# Patient Record
Sex: Male | Born: 1970 | Hispanic: No | Marital: Married | Smoking: Former smoker
Health system: Southern US, Community
[De-identification: ages and names within clinical notes are randomized; demographics above are authoritative.]

## PROBLEM LIST (undated history)

## (undated) DIAGNOSIS — E119 Type 2 diabetes mellitus without complications: Secondary | ICD-10-CM

## (undated) DIAGNOSIS — M1990 Unspecified osteoarthritis, unspecified site: Secondary | ICD-10-CM

---

## 2011-12-30 ENCOUNTER — Emergency Department (INDEPENDENT_AMBULATORY_CARE_PROVIDER_SITE_OTHER): Payer: BC Managed Care – PPO

## 2011-12-30 ENCOUNTER — Emergency Department (HOSPITAL_BASED_OUTPATIENT_CLINIC_OR_DEPARTMENT_OTHER)
Admission: EM | Admit: 2011-12-30 | Discharge: 2011-12-30 | Disposition: A | Discharge status: Home / Self Care | Payer: BC Managed Care – PPO | Attending: Emergency Medicine | Admitting: Emergency Medicine

## 2011-12-30 ENCOUNTER — Encounter (HOSPITAL_BASED_OUTPATIENT_CLINIC_OR_DEPARTMENT_OTHER): Payer: Self-pay | Admitting: Emergency Medicine

## 2011-12-30 DIAGNOSIS — M7989 Other specified soft tissue disorders: Secondary | ICD-10-CM | POA: Insufficient documentation

## 2011-12-30 DIAGNOSIS — M25579 Pain in unspecified ankle and joints of unspecified foot: Secondary | ICD-10-CM

## 2011-12-30 DIAGNOSIS — S82839A Other fracture of upper and lower end of unspecified fibula, initial encounter for closed fracture: Secondary | ICD-10-CM

## 2011-12-30 DIAGNOSIS — R937 Abnormal findings on diagnostic imaging of other parts of musculoskeletal system: Secondary | ICD-10-CM

## 2011-12-30 DIAGNOSIS — W19XXXA Unspecified fall, initial encounter: Secondary | ICD-10-CM | POA: Insufficient documentation

## 2011-12-30 DIAGNOSIS — S82899A Other fracture of unspecified lower leg, initial encounter for closed fracture: Secondary | ICD-10-CM | POA: Insufficient documentation

## 2011-12-30 DIAGNOSIS — Y9289 Other specified places as the place of occurrence of the external cause: Secondary | ICD-10-CM | POA: Insufficient documentation

## 2011-12-30 HISTORY — DX: Unspecified osteoarthritis, unspecified site: M19.90

## 2011-12-30 MED ORDER — HYDROCODONE-ACETAMINOPHEN 5-325 MG PO TABS: 1.0000 | ORAL_TABLET | Freq: Four times a day (QID) | ORAL | Status: AC | PRN

## 2011-12-30 NOTE — ED Provider Notes (Addendum)
History     CSN: [id number]  Arrival date & time 12/30/11  9562   First MD Initiated Contact with Patient 12/30/11 [phone]      Chief Complaint  Patient presents with  . Ankle Pain    (Consider location/radiation/quality/duration/timing/severity/associated sxs/prior treatment) HPI Comments: Patient presents complaining of left ankle pain.  He states that he normally gets some swelling in both of his ankles after he is on his feet all day at work.  Last night he noted the left ankle was more swollen and more painful.  He notes no injuries.  This morning when he woke up he had difficulty bearing weight on it until he took some Tylenol which has improved his symptoms.  Patient comes in here for further evaluation.  He does note some history of arthritis.  He states about a year ago he did have some similar symptoms which he believes he received prednisone for it to improve his symptoms.  He has no wounds, fevers, erythema or warmth to the area.  Patient can flex and extend the ankle without significant difficulty and was able to ambulate here in the emergency department.  Patient is a 41 y.o. male presenting with ankle pain. The history is provided by the patient. No language interpreter was used.  Ankle Pain  The incident occurred yesterday. Incident location: No injury. There was no injury mechanism. The pain is present in the left ankle. The quality of the pain is described as aching and throbbing. The pain is mild. The pain has been constant since onset. Pertinent negatives include no numbness, no inability to bear weight, no loss of motion, no muscle weakness, no loss of sensation and no tingling. He reports no foreign bodies present. The symptoms are aggravated by activity and bearing weight. He has tried acetaminophen for the symptoms. The treatment provided moderate relief.    Past Medical History  Diagnosis Date  . Ulcerative colitis   . Arthritis     History reviewed. No pertinent  past surgical history.  History reviewed. No pertinent family history.  History  Substance Use Topics  . Smoking status: Former Games developer  . Smokeless tobacco: Not on file  . Alcohol Use: Yes     social      Review of Systems  Constitutional: Negative.  Negative for fever and chills.  HENT: Negative.   Eyes: Negative.  Negative for discharge and redness.  Respiratory: Negative.  Negative for cough and shortness of breath.   Cardiovascular: Negative.  Negative for chest pain.  Gastrointestinal: Negative.  Negative for nausea, vomiting and abdominal pain.  Genitourinary: Negative.  Negative for hematuria.  Musculoskeletal: Positive for joint swelling and arthralgias. Negative for back pain.  Skin: Negative.  Negative for color change and rash.  Neurological: Negative for tingling, syncope, numbness and headaches.  Hematological: Negative.  Negative for adenopathy.  Psychiatric/Behavioral: Negative.  Negative for confusion.  All other systems reviewed and are negative.    Allergies  Review of patient's allergies indicates no known allergies.  Home Medications  No current outpatient prescriptions on file.  BP 145/87  Pulse 86  Temp(Src) 98.3 F (36.8 C) (Oral)  Resp 16  Ht 6\' 1"  (1.854 m)  Wt 240 lb (108.863 kg)  BMI 31.66 kg/m2  SpO2 96%  Physical Exam  Nursing note and vitals reviewed. Constitutional: He is oriented to person, place, and time. He appears well-developed and well-nourished.  Non-toxic appearance. He does not have a sickly appearance.  HENT:  Head: Normocephalic  and atraumatic.  Eyes: Conjunctivae, EOM and lids are normal. Pupils are equal, round, and reactive to light.  Neck: Trachea normal, normal range of motion and full passive range of motion without pain. Neck supple.  Cardiovascular: Regular rhythm.   Pulmonary/Chest: Effort normal. No respiratory distress.  Abdominal: Soft. Normal appearance. There is no CVA tenderness.  Musculoskeletal:  Normal range of motion. He exhibits edema and tenderness.       Patient does have left ankle swelling without erythema or warmth.  There is a focal area of tenderness at his posterior lateral ankle next the Achilles tendon.  No wounds are noted.  Palpable DP pulse.  Capillary refill less than 2 seconds.  Neurological: He is alert and oriented to person, place, and time. He has normal strength.  Skin: Skin is warm, dry and intact. No rash noted.  Psychiatric: He has a normal mood and affect. His behavior is normal. Judgment and thought content normal.    ED Course  Procedures (including critical care time) No results found for this or any previous visit. Dg Ankle Complete Left  12/30/2011  *RADIOLOGY REPORT*  Clinical Data: Pain and swelling  LEFT ANKLE COMPLETE - 3+ VIEW  Comparison: None.  Findings: Four views of the left ankle submitted.  Ankle mortise is preserved.  There is  subtle cortical lucency in the distal fibula and deformity of the tip of distal fibula.  This is suspicious for a subtle impacted fracture of distal fibula.  There is adjacent soft tissue swelling.  Clinical correlation is necessary.  IMPRESSION:  There is  subtle cortical lucency in the distal fibula and deformity of the tip of distal fibula.  This is suspicious for a subtle impacted fracture of distal fibula.  There is adjacent soft tissue swelling.  Clinical correlation is necessary.  Original Report Authenticated By: Natasha Mead, M.D.   US Venous Img Lower Unilateral Left  12/30/2011  **ADDENDUM** CREATED: 12/30/2011 12:31:51  This addendum is given for the purpose of noting that the initially dictated report incorrectly referred to the left upper extremity. The left lower extremity was imaged. There is no DVT.  **END ADDENDUM** SIGNED BY: Maisie Fus L. Maricela Curet, M.D.    12/30/2011  *RADIOLOGY REPORT*  Clinical Data:  Left lower extremity swelling.  LEFT UPPER EXTREMITY VENOUS DUPLEX ULTRASOUND  Technique:  Gray-scale sonography  with graded compression, as well as color Doppler and duplex ultrasound were performed to evaluate the upper extremity deep venous system from the level of the subclavian vein and including the jugular, axillary, basilic and upper cephalic vein.  Spectral Doppler was utilized to evaluate flow at rest and with distal augmentation maneuvers.  Comparison:  None.  Findings:  Normal compressibility of the upper extremity deep veins is demonstrated.  No venous filling defects visualized on grayscale or color Doppler US.  Normal direction of flow is seen throughout the deep veins.  Spectral Doppler waveforms show normal morphology at rest and with distal augmentation.  IMPRESSION: No evidence of upper extremity deep venous thrombosis.  Original Report Authenticated By: Bernadene Bell. D'ALESSIO, M.D.      MDM  I will obtain an ankle x-ray to evaluate for further signs of arthritis or other injury.  While the patient is otherwise low risk for blood clot given the unilateral leg swelling I will obtain a lower extremity Doppler to rule out DVT.        Nat Christen, MD 12/30/11 [phone]  Patient has no DVT on his ultrasound today.  He does have a possible subtle impacted fracture of his distal fibula.  I will offer the patient an air splint and crutches to be used as needed and followup with a orthopedic or sports medicine specialist for further evaluation.  Nat Christen, MD 12/30/11 [phone]

## 2011-12-30 NOTE — ED Notes (Signed)
Pt returned from radiology.

## 2011-12-30 NOTE — ED Notes (Signed)
Pt c/o LT ankle pain/swelling since last pm; no known injury; has problems with swelling after working (on feet all day), but usually goes down in the am

## 2011-12-30 NOTE — ED Notes (Signed)
Delay for [place] d/t [place] tech hours begin at 11am; test was inadvertently ordered under "vascular," therefore did not show up in radiology; order re-entered; radiology aware; pt made aware.

## 2013-03-26 ENCOUNTER — Emergency Department (HOSPITAL_BASED_OUTPATIENT_CLINIC_OR_DEPARTMENT_OTHER)
Admission: EM | Admit: 2013-03-26 | Discharge: 2013-03-26 | Disposition: A | Discharge status: Home / Self Care | Payer: BC Managed Care – PPO | Attending: Emergency Medicine | Admitting: Emergency Medicine

## 2013-03-26 ENCOUNTER — Encounter (HOSPITAL_BASED_OUTPATIENT_CLINIC_OR_DEPARTMENT_OTHER): Payer: Self-pay | Admitting: *Deleted

## 2013-03-26 DIAGNOSIS — Z8739 Personal history of other diseases of the musculoskeletal system and connective tissue: Secondary | ICD-10-CM | POA: Insufficient documentation

## 2013-03-26 DIAGNOSIS — T1500XA Foreign body in cornea, unspecified eye, initial encounter: Secondary | ICD-10-CM | POA: Insufficient documentation

## 2013-03-26 DIAGNOSIS — H5789 Other specified disorders of eye and adnexa: Secondary | ICD-10-CM | POA: Insufficient documentation

## 2013-03-26 DIAGNOSIS — H5710 Ocular pain, unspecified eye: Secondary | ICD-10-CM | POA: Insufficient documentation

## 2013-03-26 DIAGNOSIS — T1590XA Foreign body on external eye, part unspecified, unspecified eye, initial encounter: Secondary | ICD-10-CM | POA: Insufficient documentation

## 2013-03-26 DIAGNOSIS — T1502XA Foreign body in cornea, left eye, initial encounter: Secondary | ICD-10-CM

## 2013-03-26 DIAGNOSIS — Z87891 Personal history of nicotine dependence: Secondary | ICD-10-CM | POA: Insufficient documentation

## 2013-03-26 DIAGNOSIS — K5190 Ulcerative colitis, unspecified, without complications: Secondary | ICD-10-CM | POA: Insufficient documentation

## 2013-03-26 DIAGNOSIS — Y929 Unspecified place or not applicable: Secondary | ICD-10-CM | POA: Insufficient documentation

## 2013-03-26 DIAGNOSIS — H538 Other visual disturbances: Secondary | ICD-10-CM | POA: Insufficient documentation

## 2013-03-26 DIAGNOSIS — Y939 Activity, unspecified: Secondary | ICD-10-CM | POA: Insufficient documentation

## 2013-03-26 MED ORDER — FLUORESCEIN SODIUM 1 MG OP STRP
1.0000 | ORAL_STRIP | Freq: Once | OPHTHALMIC | Status: AC
  Administered 2013-03-26: 1 via OPHTHALMIC
  Filled 2013-03-26: qty 1

## 2013-03-26 MED ORDER — CIPROFLOXACIN HCL 0.3 % OP SOLN
2.0000 [drp] | OPHTHALMIC | Status: DC
Start: 2013-03-26 — End: 2013-03-26
  Administered 2013-03-26: 2 [drp] via OPHTHALMIC
  Filled 2013-03-26: qty 2.5

## 2013-03-26 MED ORDER — TETRACAINE HCL 0.5 % OP SOLN
1.0000 [drp] | Freq: Once | OPHTHALMIC | Status: AC
  Administered 2013-03-26: 1 [drp] via OPHTHALMIC
  Filled 2013-03-26: qty 2

## 2013-03-26 NOTE — ED Notes (Signed)
Pt states his eye has been irritated for 2 days. Thinks he may have gotten something in it while putting stuff up at work. Has been rinsing with water and using Visine. Eye is red. Light sensitive.

## 2013-03-26 NOTE — ED Provider Notes (Signed)
History     CSN: [id number]  Arrival date & time 03/26/13  1132   First MD Initiated Contact with Patient 03/26/13 1228      Chief Complaint  Patient presents with  . Eye Problem    (Consider location/radiation/quality/duration/timing/severity/associated sxs/prior treatment) HPI Comments: Pt was working on Thursday and felt dust or other material get into his eye, left.  Pt flushed and has used viseine withotu sig relief.  Has gotten more red, teary.  No HA, fever.  Vision has gotten more blurred.  Pt tried to work today, came to the ED from work . In bright light, pain is worse.  With sun glasses in place, no sig pain.  Does not wear contacts or glasses, does not have an eye doctor.    The history is provided by the patient.    Past Medical History  Diagnosis Date  . Ulcerative colitis   . Arthritis     History reviewed. No pertinent past surgical history.  History reviewed. No pertinent family history.  History  Substance Use Topics  . Smoking status: Former Games developer  . Smokeless tobacco: Not on file  . Alcohol Use: Yes     Comment: social      Review of Systems  Eyes: Positive for pain, discharge, redness and visual disturbance.  Gastrointestinal: Negative for nausea and vomiting.  Skin: Negative for rash.  Neurological: Negative for dizziness and headaches.    Allergies  Review of patient's allergies indicates no known allergies.  Home Medications  No current outpatient prescriptions on file.  BP 147/102  Pulse 94  Temp(Src) 99.7 F (37.6 C) (Oral)  Resp 20  Ht 6\' 1"  (1.854 m)  Wt 242 lb (109.77 kg)  BMI 31.93 kg/m2  SpO2 98%  Physical Exam  Nursing note and vitals reviewed. Constitutional: He appears well-developed and well-nourished.  HENT:  Head: Normocephalic and atraumatic.  Eyes: EOM are normal. Pupils are equal, round, and reactive to light. Left eye exhibits chemosis. Left conjunctiva is injected. Left conjunctiva has no hemorrhage. Left  eye exhibits normal extraocular motion.  Slit lamp exam:      The left eye shows foreign body and fluorescein uptake. The left eye shows no corneal abrasion, no corneal flare and no corneal ulcer.    Pulmonary/Chest: Effort normal. No respiratory distress.  Neurological: He is alert.  Skin: Skin is warm.    ED Course  Procedures (including critical care time)  Labs Reviewed - No data to display No results found.   1. Corneal foreign body, left, initial encounter     ra sat is 98% and I interpret to be normal  MDM  Pt with corneal FB on left eye.  Dr. Gwen Pounds will see pt in office today or Monday.  Pt's contact information shared with Dr. Gwen Pounds.  Ok with abx eye drops from here, pt repors pain is not very severe.          Gavin Pound. Wallis Vancott, MD 03/26/13 1410

## 2013-03-26 NOTE — Discharge Instructions (Signed)
Corneal Foreign Body  A corneal foreign body is an injury from material in your eye. This foreign body became stuck in (lodged) in the clear layer that covers the front part of the eye. Specks of metal, sand or wood commonly cause this injury. Using a local anesthetic, your caregiver removed the foreign body in your cornea. This local anesthetic is a medication that makes the cornea numb. Your eye will be painful when the local anesthetic wears off. Blinking the eye increases pain, so sometimes a patch is applied to eliminate this. The more you rest your "good eye", the better both eyes will feel.  HOME CARE INSTRUCTIONS    The use of eye patches is not universal and their use varies from state to state and from caregiver to caregiver. If eye patch was applied:   Keep your eye patch on for as long as directed by your caregiver until your follow-up appointment.   Do NOT remove the patch unless instructed to do so to put in medications; then replace patch and re-tape it as it was before. Follow the same procedure if the patch becomes loose.   WARNING: Do not drive or operate machinery while your eye is patched. Your ability to judge distances is impaired.   If no eye patch was applied:   Keep your eye closed as much as possible. Do not rub your eye.   Wear dark glasses for as long as directed by your caregiver to protect your eyes from bright light.   Do not wear contact lenses for as long as directed by your caregiver.   Wear protective eye covering if your job or hobby involves the risk of eye injury. This is especially important when working with high speed tools.   Only take over-the-counter or prescription medicines for pain, discomfort, or fever as directed by your caregiver.  SEEK IMMEDIATE MEDICAL CARE IF:    Pain increases in your eye or your vision changes.   You have problems with your eye patch.   The injury to your eye appears to be getting larger.   You develop any kind of discharge from  the injured eye.   Swelling and/or soreness (inflammation) develops around the affected eye.   An oral temperature above 102 F (38.9 C) develops.  MAKE SURE YOU:    Understand these instructions.   Will watch your condition.   Will get help right away if you are not doing well or get worse.  Document Released: 11/28/2000 Document Revised: 02/23/2012 Document Reviewed: 07/19/2008  [employer] Patient Information 2013 [employer].

## 2013-04-10 IMAGING — US US EXTREM LOW VENOUS*L*
1 series · 13 of 21 positions shown · non-contrast
Comparison: None.
COMPARISON: None.

<!--  IDXRADR:ADDEND:BEGIN -->Addendum Begins
<!--  IDXRADR:ADDEND:INNER_BEGIN -->***ADDENDUM*** CREATED: 12/30/2011 [DATE]

This addendum is given for the purpose of noting that the initially
dictated report incorrectly referred to the left upper extremity.
The left lower extremity was imaged. There is no DVT.
***END ADDENDUM*** SIGNED BY: Sonali Rafa, M.D.
CLINICAL DATA: Left lower extremity swelling.
LEFT UPPER EXTREMITY VENOUS DUPLEX ULTRASOUND
TECHNIQUE: Gray-scale sonography with graded compression, as well
as color Doppler and duplex ultrasound were performed to evaluate
the upper extremity deep venous system from the level of the
subclavian vein and including the jugular, axillary, basilic and
upper cephalic vein.  Spectral Doppler was utilized to evaluate
flow at rest and with distal augmentation maneuvers.

[Series 1: us extrem low venous*left* · 13 of 21 slices shown]
[im 1/21]
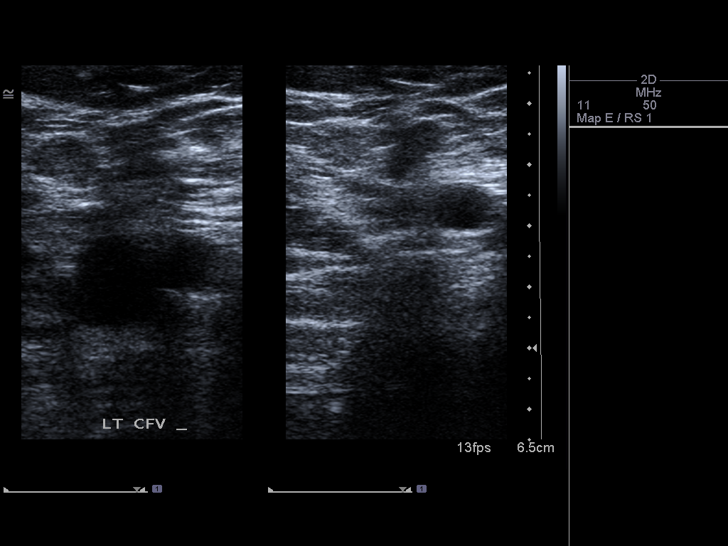
[im 3/21]
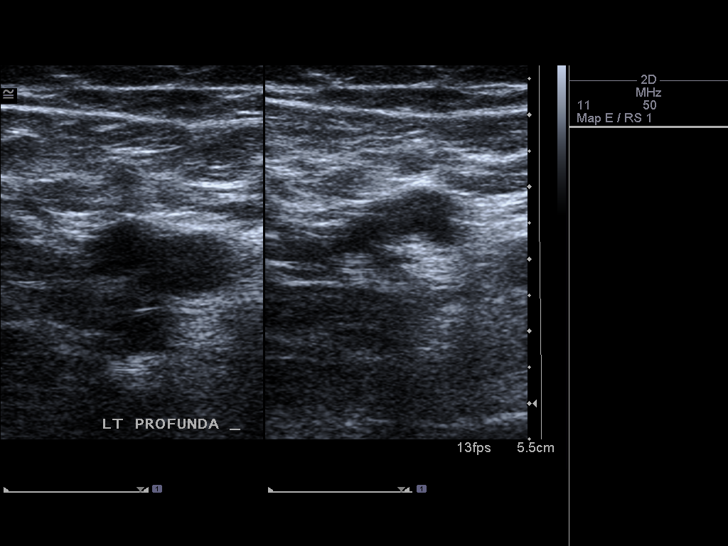
[im 5/21]
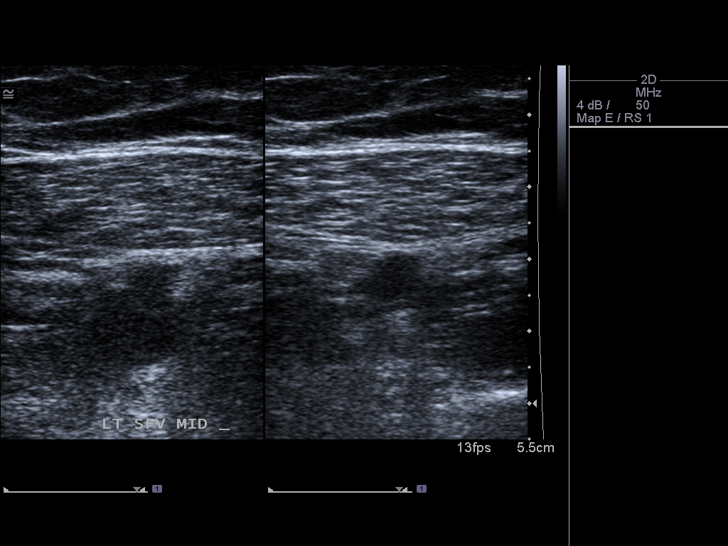
[im 6/21]
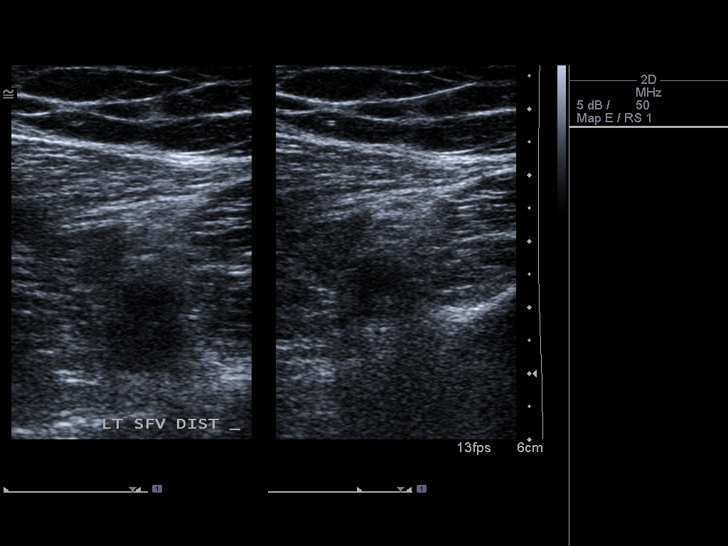
[im 8/21]
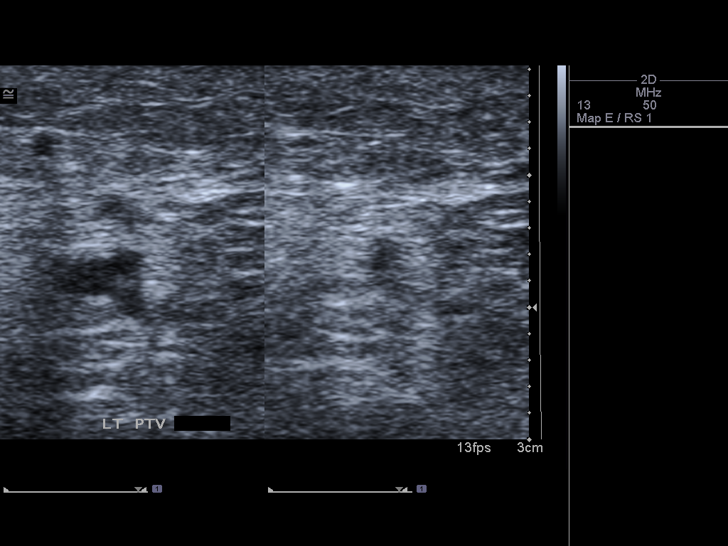
[im 9/21]
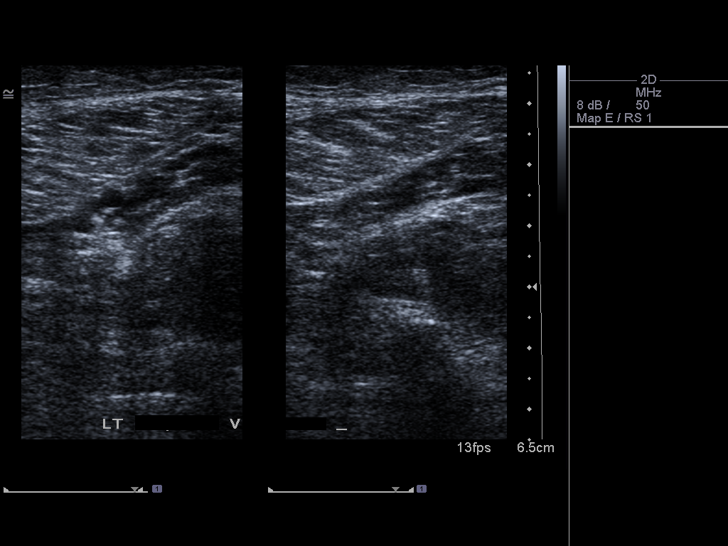
[im 11/21]
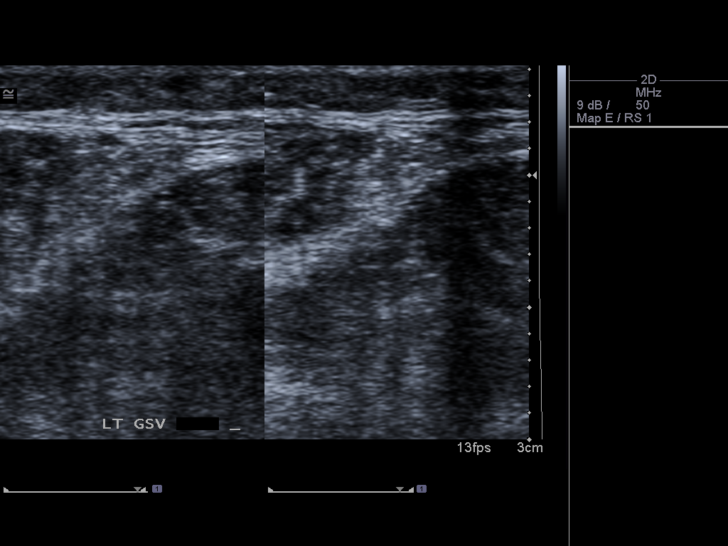
[im 13/21]
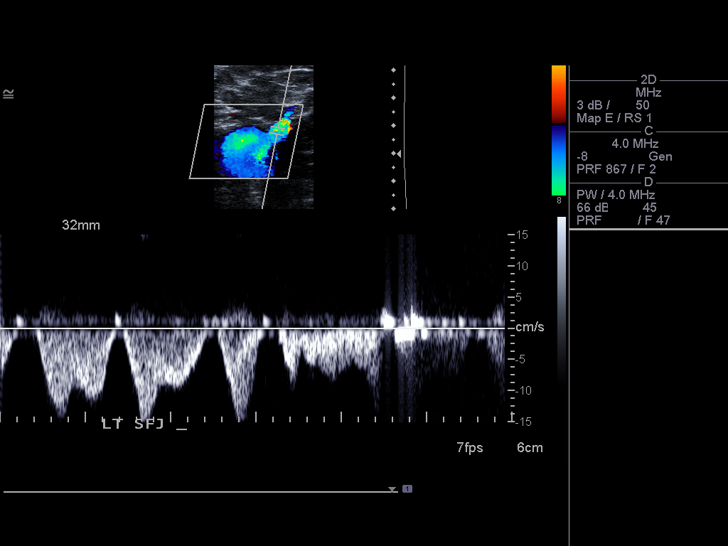
[im 14/21]
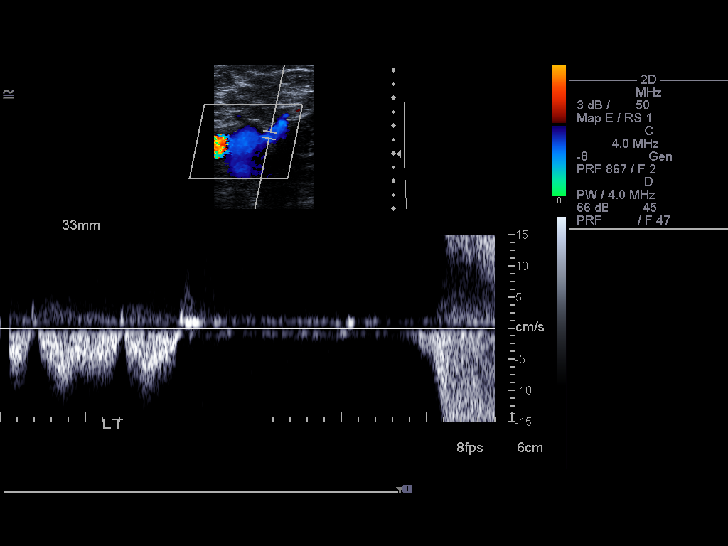
[im 16/21]
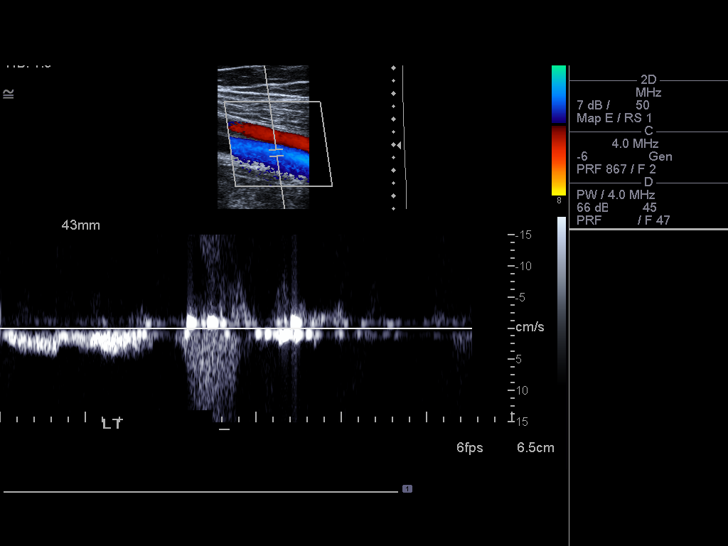
[im 17/21]
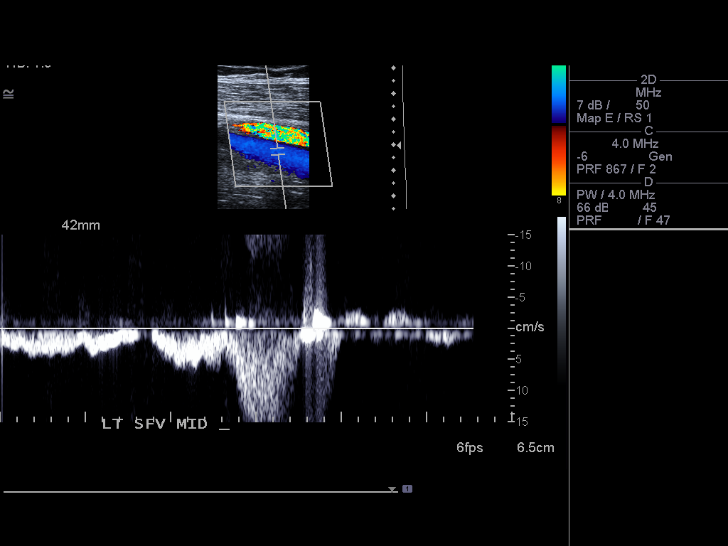
[im 19/21]
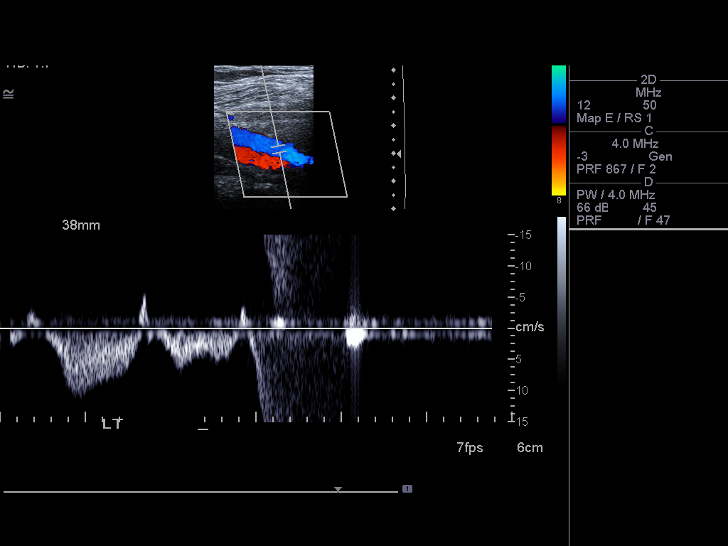
[im 21/21]
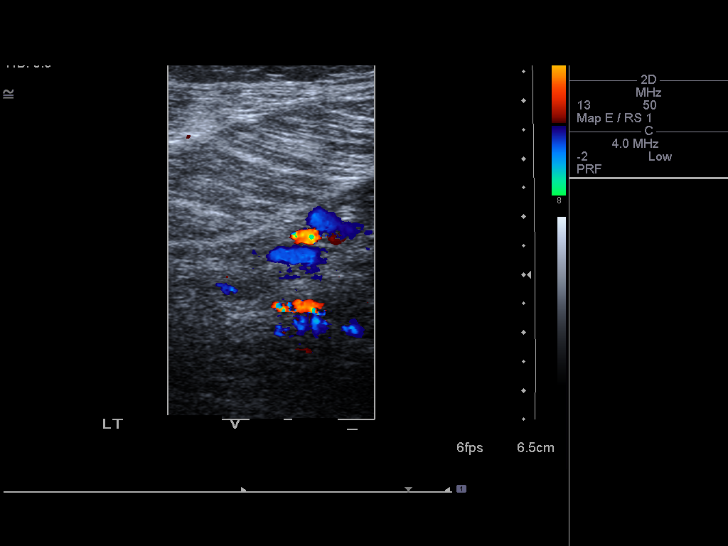

[13 of 21 positions shown; findings below may reference images not displayed]

FINDINGS: Normal compressibility of the upper extremity deep veins
is demonstrated.  No venous filling defects visualized on grayscale
or color Doppler US.  Normal direction of flow is seen throughout
the deep veins.  Spectral Doppler waveforms show normal morphology
at rest and with distal augmentation.
IMPRESSION: No evidence of upper extremity deep venous thrombosis.

<!--  IDXRADR:ADDEND:INNER_END -->Addendum Ends
<!--  IDXRADR:ADDEND:END -->*RADIOLOGY REPORT*
FINDINGS: Normal compressibility of the upper extremity deep veins
is demonstrated.  No venous filling defects visualized on grayscale
or color Doppler US.  Normal direction of flow is seen throughout
the deep veins.  Spectral Doppler waveforms show normal morphology
at rest and with distal augmentation.
IMPRESSION: No evidence of upper extremity deep venous thrombosis.

## 2013-11-02 ENCOUNTER — Telehealth: Payer: Self-pay | Admitting: Hematology & Oncology

## 2013-11-02 NOTE — Telephone Encounter (Signed)
Left vm w NEW PATIENT today to remind them of their appointment with Dr. Ennever. Also, advised them to bring all meds and insurance information. ° °

## 2013-11-03 ENCOUNTER — Ambulatory Visit: Payer: BC Managed Care – PPO

## 2013-11-03 ENCOUNTER — Other Ambulatory Visit (HOSPITAL_BASED_OUTPATIENT_CLINIC_OR_DEPARTMENT_OTHER): Payer: BC Managed Care – PPO | Admitting: Lab

## 2013-11-03 ENCOUNTER — Ambulatory Visit (HOSPITAL_BASED_OUTPATIENT_CLINIC_OR_DEPARTMENT_OTHER): Payer: BC Managed Care – PPO | Admitting: Hematology & Oncology

## 2013-11-03 VITALS — BP 145/87 | HR 88 | Temp 98.7°F | Resp 18 | Ht 72.0 in | Wt 235.0 lb

## 2013-11-03 DIAGNOSIS — D473 Essential (hemorrhagic) thrombocythemia: Secondary | ICD-10-CM

## 2013-11-03 LAB — FERRITIN CHCC: Ferritin: 190 ng/ml (ref 22–316)

## 2013-11-03 LAB — CHCC SATELLITE - SMEAR

## 2013-11-03 LAB — IRON AND TIBC CHCC
TIBC: 293 ug/dL (ref 202–409)
UIBC: 265 ug/dL (ref 117–376)

## 2013-11-03 LAB — CBC WITH DIFFERENTIAL (CANCER CENTER ONLY)
BASO#: 0 10*3/uL (ref 0.0–0.2)
EOS%: 4.6 % (ref 0.0–7.0)
Eosinophils Absolute: 0.4 10*3/uL (ref 0.0–0.5)
HGB: 12 g/dL — ABNORMAL LOW (ref 13.0–17.1)
LYMPH%: 19.2 % (ref 14.0–48.0)
MCH: 30.5 pg (ref 28.0–33.4)
MCHC: 31.8 g/dL — ABNORMAL LOW (ref 32.0–35.9)
MCV: 96 fL (ref 82–98)
MONO%: 7.7 % (ref 0.0–13.0)
Platelets: 432 10*3/uL — ABNORMAL HIGH (ref 145–400)
RBC: 3.94 10*6/uL — ABNORMAL LOW (ref 4.20–5.70)

## 2013-11-03 NOTE — Progress Notes (Signed)
This office note has been dictated.

## 2013-11-04 ENCOUNTER — Telehealth: Payer: Self-pay | Admitting: *Deleted

## 2013-11-04 NOTE — Telephone Encounter (Addendum)
Message copied by Mirian Capuchin on Fri Nov 04, 2013  1:29 PM ------      Message from: Josph Macho      Created: Fri Nov 04, 2013  6:22 AM       Call - iron levels are low!!  He really needs to see his GI doctor and have an upper and lower endoscopy done!!!  This is NECESSARY!!!!  If needed, I can make a referral!  Cindee Lame ------Left message on pt's home answering machine for pt to call us back about his iron levels.

## 2013-11-14 ENCOUNTER — Encounter: Payer: Self-pay | Admitting: Hematology & Oncology
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
## 2016-10-30 ENCOUNTER — Emergency Department (HOSPITAL_BASED_OUTPATIENT_CLINIC_OR_DEPARTMENT_OTHER)
Admission: EM | Admit: 2016-10-30 | Discharge: 2016-10-30 | Disposition: A | Discharge status: Home / Self Care | Payer: BLUE CROSS/BLUE SHIELD | Attending: Emergency Medicine | Admitting: Emergency Medicine

## 2016-10-30 ENCOUNTER — Encounter (HOSPITAL_BASED_OUTPATIENT_CLINIC_OR_DEPARTMENT_OTHER): Payer: Self-pay | Admitting: Emergency Medicine

## 2016-10-30 DIAGNOSIS — Z79899 Other long term (current) drug therapy: Secondary | ICD-10-CM | POA: Diagnosis not present

## 2016-10-30 DIAGNOSIS — Z87891 Personal history of nicotine dependence: Secondary | ICD-10-CM | POA: Diagnosis not present

## 2016-10-30 DIAGNOSIS — R739 Hyperglycemia, unspecified: Secondary | ICD-10-CM

## 2016-10-30 DIAGNOSIS — R350 Frequency of micturition: Secondary | ICD-10-CM | POA: Diagnosis present

## 2016-10-30 DIAGNOSIS — E1165 Type 2 diabetes mellitus with hyperglycemia: Secondary | ICD-10-CM | POA: Insufficient documentation

## 2016-10-30 HISTORY — DX: Type 2 diabetes mellitus without complications: E11.9

## 2016-10-30 LAB — COMPREHENSIVE METABOLIC PANEL
ALT: 31 U/L (ref 17–63)
AST: 29 U/L (ref 15–41)
Albumin: 4.3 g/dL (ref 3.5–5.0)
Alkaline Phosphatase: 99 U/L (ref 38–126)
Anion gap: 8 (ref 5–15)
BILIRUBIN TOTAL: 0.3 mg/dL (ref 0.3–1.2)
BUN: 19 mg/dL (ref 6–20)
CHLORIDE: 98 mmol/L — AB (ref 101–111)
CO2: 27 mmol/L (ref 22–32)
CREATININE: 0.99 mg/dL (ref 0.61–1.24)
Calcium: 10 mg/dL (ref 8.9–10.3)
Glucose, Bld: 346 mg/dL — ABNORMAL HIGH (ref 65–99)
POTASSIUM: 3.9 mmol/L (ref 3.5–5.1)
Sodium: 133 mmol/L — ABNORMAL LOW (ref 135–145)
TOTAL PROTEIN: 9.9 g/dL — AB (ref 6.5–8.1)

## 2016-10-30 LAB — URINE MICROSCOPIC-ADD ON

## 2016-10-30 LAB — CBC WITH DIFFERENTIAL/PLATELET
BASOS PCT: 0 %
Basophils Absolute: 0 10*3/uL (ref 0.0–0.1)
EOS PCT: 6 %
Eosinophils Absolute: 0.4 10*3/uL (ref 0.0–0.7)
HEMATOCRIT: 39.3 % (ref 39.0–52.0)
Hemoglobin: 13.1 g/dL (ref 13.0–17.0)
LYMPHS ABS: 1.9 10*3/uL (ref 0.7–4.0)
Lymphocytes Relative: 26 %
MCH: 30.3 pg (ref 26.0–34.0)
MCHC: 33.3 g/dL (ref 30.0–36.0)
MCV: 90.8 fL (ref 78.0–100.0)
MONO ABS: 0.7 10*3/uL (ref 0.1–1.0)
MONOS PCT: 10 %
NEUTROS ABS: 4.4 10*3/uL (ref 1.7–7.7)
Neutrophils Relative %: 58 %
PLATELETS: 351 10*3/uL (ref 150–400)
RBC: 4.33 MIL/uL (ref 4.22–5.81)
RDW: 12.5 % (ref 11.5–15.5)
WBC: 7.4 10*3/uL (ref 4.0–10.5)

## 2016-10-30 LAB — URINALYSIS, ROUTINE W REFLEX MICROSCOPIC
BILIRUBIN URINE: NEGATIVE
HGB URINE DIPSTICK: NEGATIVE
KETONES UR: NEGATIVE mg/dL
Leukocytes, UA: NEGATIVE
NITRITE: NEGATIVE
PH: 5.5 (ref 5.0–8.0)
Protein, ur: NEGATIVE mg/dL
SPECIFIC GRAVITY, URINE: 1.031 — AB (ref 1.005–1.030)

## 2016-10-30 LAB — CBG MONITORING, ED: Glucose-Capillary: 311 mg/dL — ABNORMAL HIGH (ref 65–99)

## 2016-10-30 MED ORDER — SODIUM CHLORIDE 0.9 % IV BOLUS (SEPSIS)
1000.0000 mL | Freq: Once | INTRAVENOUS | Status: AC
  Administered 2016-10-30: 1000 mL via INTRAVENOUS

## 2016-10-30 MED ORDER — METFORMIN HCL 500 MG PO TABS: 500.0000 mg | ORAL_TABLET | Freq: Two times a day (BID) | ORAL | 0 refills | Status: AC

## 2016-10-30 MED FILL — metFORMIN HCL 500 MG TABS: 500 | 15 days supply | Qty: 30 | Fill #0

## 2016-10-30 NOTE — ED Triage Notes (Signed)
Frequent urination noticed yesterday denies pain. Hx of DM and vasculitis.

## 2016-10-30 NOTE — ED Notes (Signed)
ED Provider at bedside. 

## 2016-10-30 NOTE — ED Provider Notes (Signed)
[facility] Provider Note   CSN: [id number] Arrival date & time: 10/30/16  [id number]     History   Chief Complaint Chief Complaint  Patient presents with  . Urinary Frequency    HPI Duane Kline is a 45 y.o. male.  HPI 45 year old male who presents with urinary frequency. History of vasculitis and Ulcerative colitis. Also history of diabetes mellitus which was diagnosed a few years ago after a bout of steroids. States that he was on insulin and metformin for a while, but had weight loss, with exercising, was able to be discontinued on his diabetes medications 2 years ago. Since then has not needed medications for diabetes. States that over the summer his wife lost her job, and as a result he has been working more, eating less consciously, and having less exercise. Has had cold-like symptoms 2 weeks ago, which have been resolving. On this week as noticed that he has been having increased thirst and increased urination. This was worse last night where he was urinating every 5-10 minutes.  No fevers, chills, chest pain, difficulty breathing, nausea or vomiting, diarrhea, abdominal pain. No dysuria. He has history of chronic left lower extremity wound, which she has recently been taking Bactrim for it, but states that his wound has looked a lot better than what it normally has.   Past Medical History:  Diagnosis Date  . Arthritis   . Diabetes mellitus without complication ([place])   . Ulcerative colitis     There are no active problems to display for this patient.   History reviewed. No pertinent surgical history.     Home Medications    Prior to Admission medications   Medication Sig Start Date End Date Taking? Authorizing Provider  sulfamethoxazole-trimethoprim (BACTRIM DS,SEPTRA DS) 800-160 MG tablet Take 1 tablet by mouth 2 (two) times daily.   Yes Historical Provider, MD  hydrochlorothiazide (HYDRODIURIL) 25 MG tablet Take 25 mg by mouth daily.    Historical  Provider, MD  metFORMIN (GLUCOPHAGE) 500 MG tablet Take 1 tablet (500 mg total) by mouth 2 (two) times daily with a meal. 10/30/16   Forde Dandy, MD    Family History No family history on file. Reviewed, non-contributory Social History Social History  Substance Use Topics  . Smoking status: Former Research scientist (life sciences)  . Smokeless tobacco: Never Used  . Alcohol use Yes     Comment: social     Allergies   Patient has no known allergies.   Review of Systems Review of Systems 10/14 systems reviewed and are negative other than those stated in the HPI   Physical Exam Updated Vital Signs BP (!) 159/106 (BP Location: Left Arm)   Pulse 100   Temp 98.2 F (36.8 C) (Oral)   Resp 18   Ht 6\' 1"  (1.854 m)   Wt 236 lb (107 kg)   SpO2 100%   BMI 31.14 kg/m   Physical Exam Physical Exam  Nursing note and vitals reviewed. Constitutional: Well developed, well nourished, non-toxic, and in no acute distress Head: Normocephalic and atraumatic.  Mouth/Throat: Oropharynx is clear and moist.  Neck: Normal range of motion. Neck supple.  Cardiovascular: Normal rate and regular rhythm.   Pulmonary/Chest: Effort normal and breath sounds normal.  Abdominal: Soft. There is no tenderness. There is no rebound and no guarding.  Musculoskeletal: Chronic LLE wound, w/o significant drainage or tenderness Neurological: Alert, no facial droop, fluent speech, moves all extremities symmetrically Skin: Skin is warm and dry.  Psychiatric:  Cooperative   ED Treatments / Results  Labs (all labs ordered are listed, but only abnormal results are displayed) Labs Reviewed  URINALYSIS, ROUTINE W REFLEX MICROSCOPIC (NOT AT [facility]) - Abnormal; Notable for the following:       Result Value   Specific Gravity, Urine 1.031 (*)    Glucose, UA >1000 (*)    All other components within normal limits  COMPREHENSIVE METABOLIC PANEL - Abnormal; Notable for the following:    Sodium 133 (*)    Chloride 98 (*)    Glucose, Bld  346 (*)    Total Protein 9.9 (*)    All other components within normal limits  URINE MICROSCOPIC-ADD ON - Abnormal; Notable for the following:    Squamous Epithelial / LPF 0-5 (*)    Bacteria, UA RARE (*)    All other components within normal limits  CBG MONITORING, ED - Abnormal; Notable for the following:    Glucose-Capillary 311 (*)    All other components within normal limits  CBC WITH DIFFERENTIAL/PLATELET    EKG  EKG Interpretation None       Radiology No results found.  Procedures Procedures (including critical care time)  Medications Ordered in ED Medications  sodium chloride 0.9 % bolus 1,000 mL (0 mLs Intravenous Stopped 10/30/16 0927)     Initial Impression / Assessment and Plan / ED Course  I have reviewed the triage vital signs and the nursing notes.  Pertinent labs & imaging results that were available during my care of the patient were reviewed by me and considered in my medical decision making (see chart for details).  Clinical Course     Presenting with urinary frequency and increased thirst over the course of this past week. This is due to his hyperglycemia as his point-of-care glucose is 311 here today. Has previously been treated for type 2 diabetes with insulin and metformin, but able to be weaned off of these medications due to weight loss and exercise. With his recent lifestyle changes, this is likely causing him to have recurrent blood sugar issues. No evidence of serious infection on exam. He has chronic wound in the left lower extremity, looking better than before, and unlikely to be causing his symptoms here today. Mild URI symptoms, resolving, and felt unlikely to be URI to have drastically caused this. No evidence of DKA blood work, otherwise reassuring. We will have patient restart metformin, and have very close follow-up with the primary care doctor for further management.  Final Clinical Impressions(s) / ED Diagnoses   Final diagnoses:    Hyperglycemia    New Prescriptions New Prescriptions   METFORMIN (GLUCOPHAGE) 500 MG TABLET    Take 1 tablet (500 mg total) by mouth 2 (two) times daily with a meal.     Forde Dandy, MD 10/30/16 [id number]

## 2016-10-30 NOTE — ED Notes (Signed)
Pt verbalized understanding of discharge instructions and denies any further questions at this time.   Pt will go down to the pharmacy

## 2016-10-30 NOTE — Discharge Instructions (Signed)
Please call your primary care to set up close follow-up appointment regarding management of your blood sugar.  You can restart meformin in the interim. Monitor your blood sugar at home closely.  Return for worsening symptoms, including confusion, vomiting, fever, intractable vomiting or any other symptoms concerning to you.

## 2016-10-30 NOTE — ED Notes (Signed)
CBG is 311 RN Brandi aware.
